# Patient Record
Sex: Female | Born: 2000 | Race: White | Hispanic: No | Marital: Single | Smoking: Current some day smoker
Health system: Southern US, Community
[De-identification: ages and names within clinical notes are randomized; demographics above are authoritative.]

---
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
## 2022-02-23 IMAGING — US US ABDOMEN LIMITED
1 series · 14 of 25 positions shown · non-contrast
Comparison: None.

CLINICAL DATA: Right upper quadrant abdominal pain.

EXAM:
ULTRASOUND ABDOMEN LIMITED RIGHT UPPER QUADRANT

[Series 1: us abdomen limited ruq (liver/gb) · 14 of 48 slices shown]
[im 1/48]
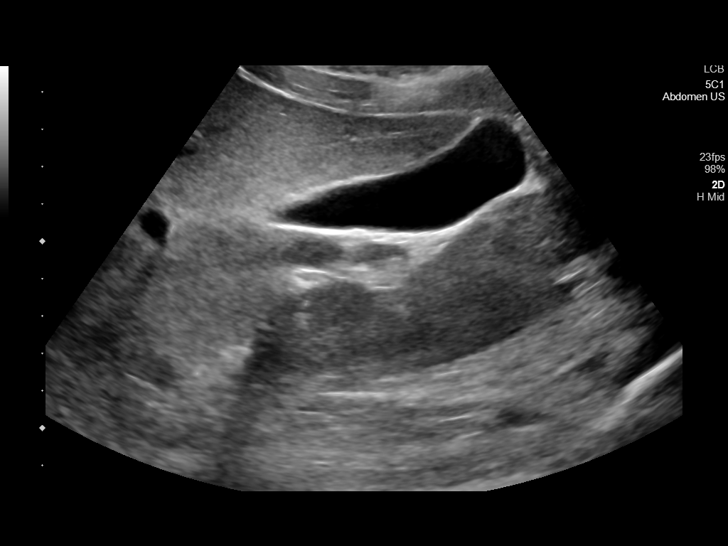
[im 4/48]
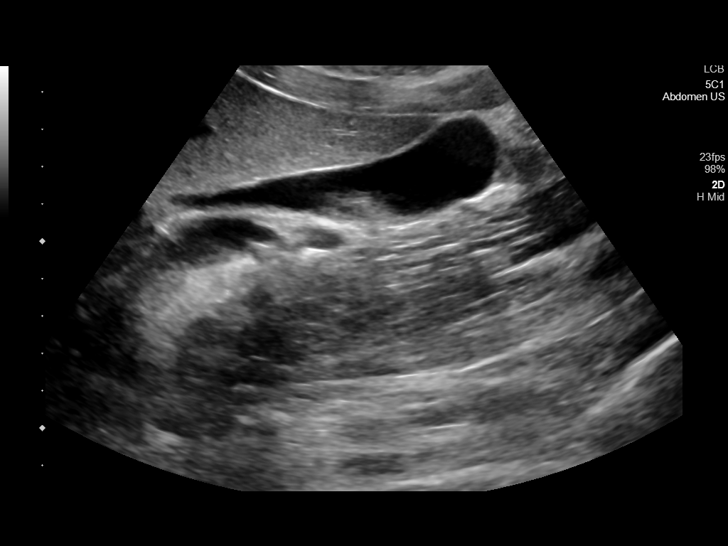
[im 8/48]
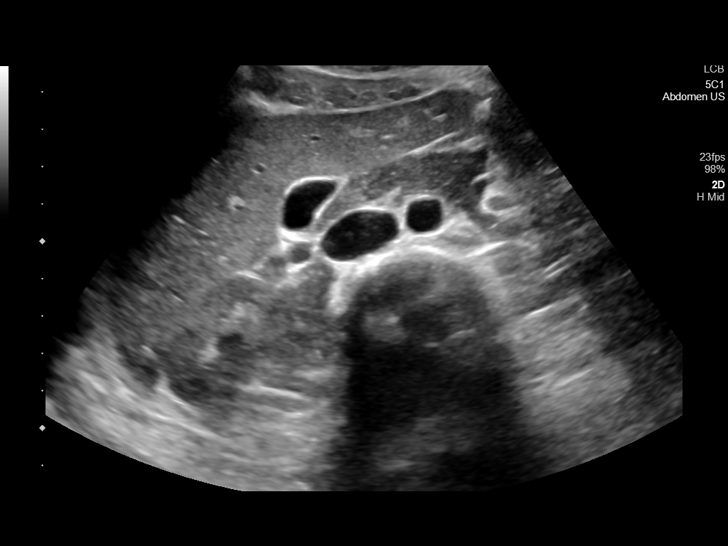
[im 12/48]
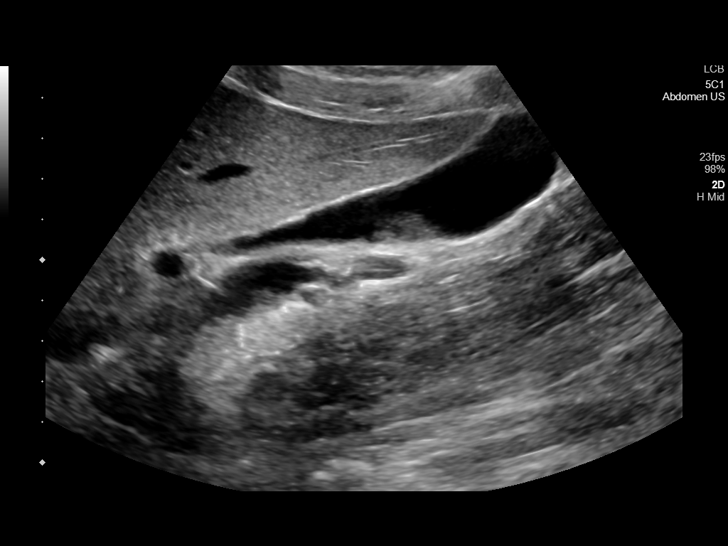
[im 16/48]
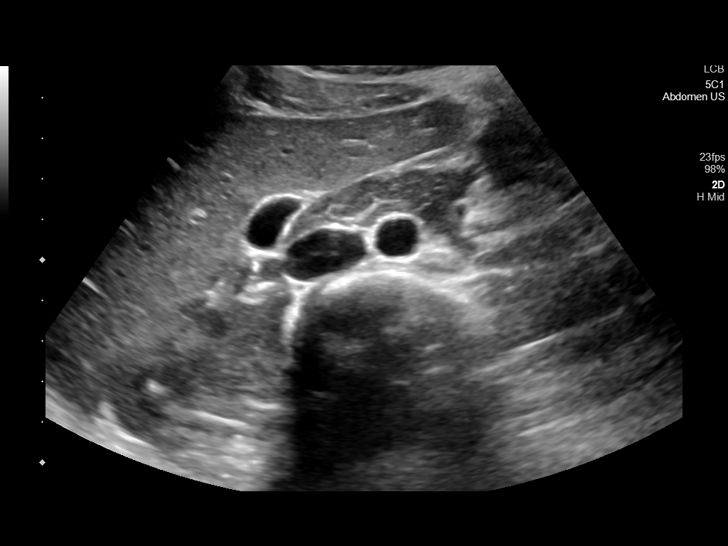
[im 18/48]
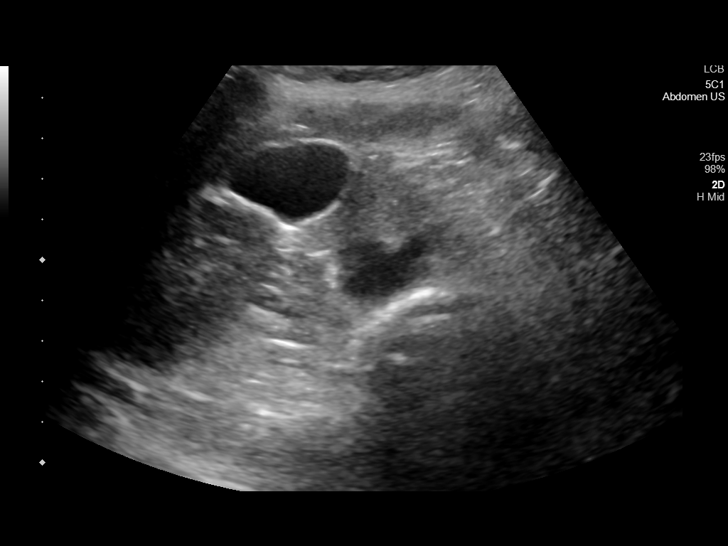
[im 22/48]
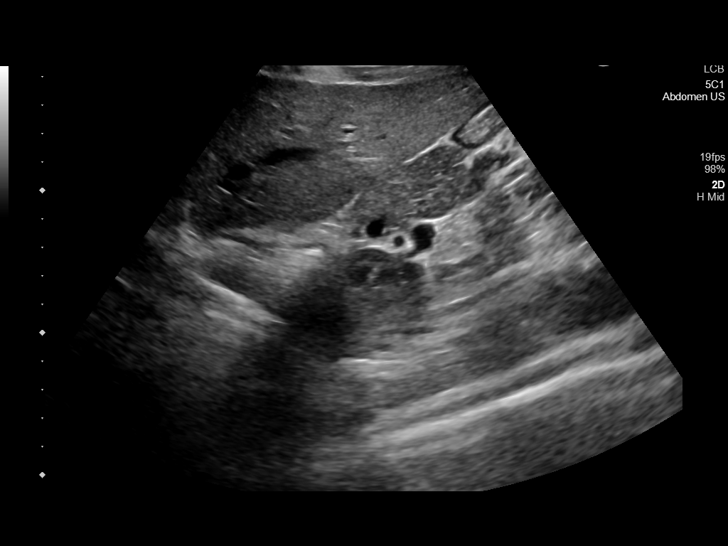
[im 26/48]
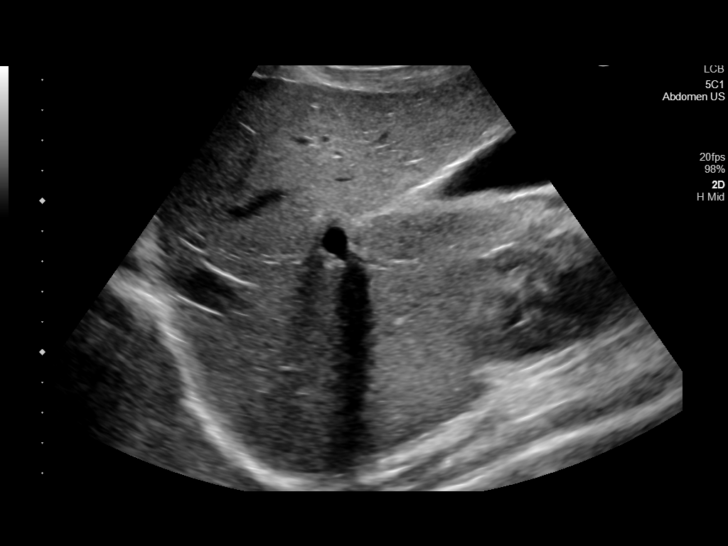
[im 30/48]
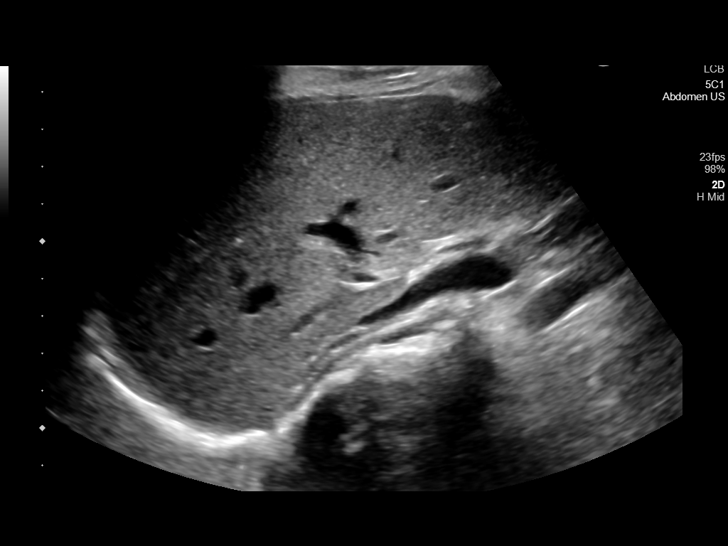
[im 32/48]
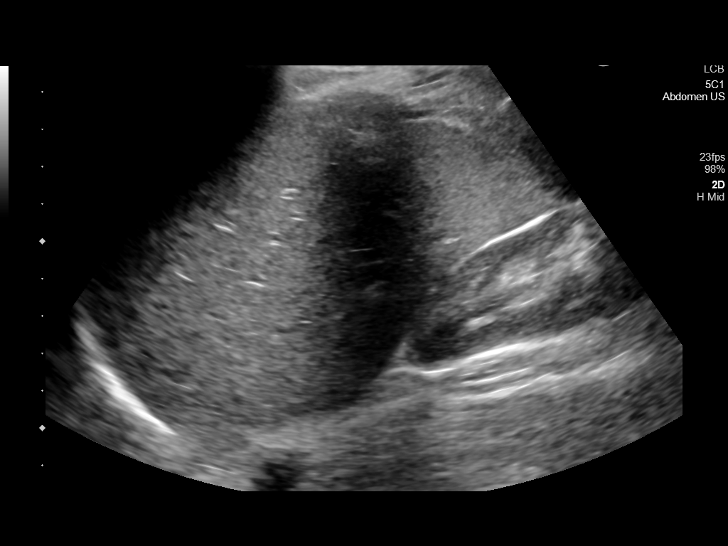
[im 36/48]
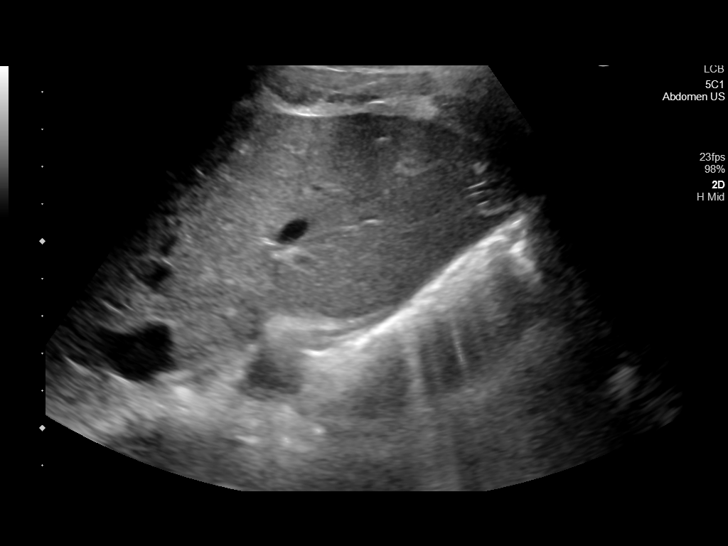
[im 40/48]
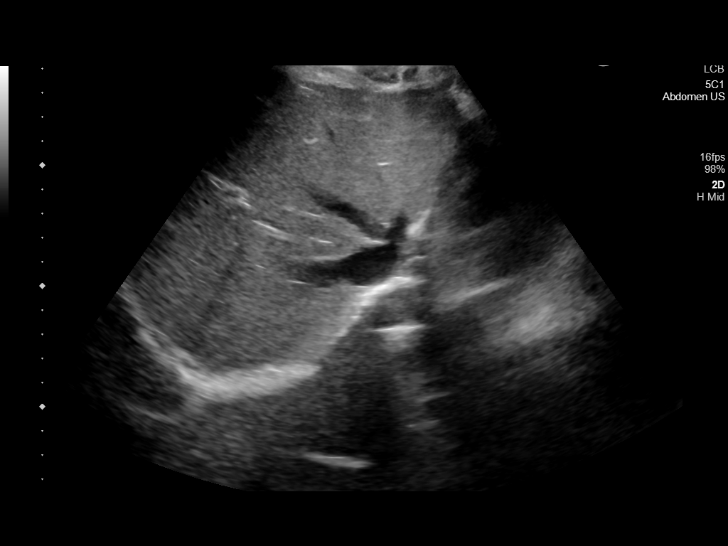
[im 44/48]
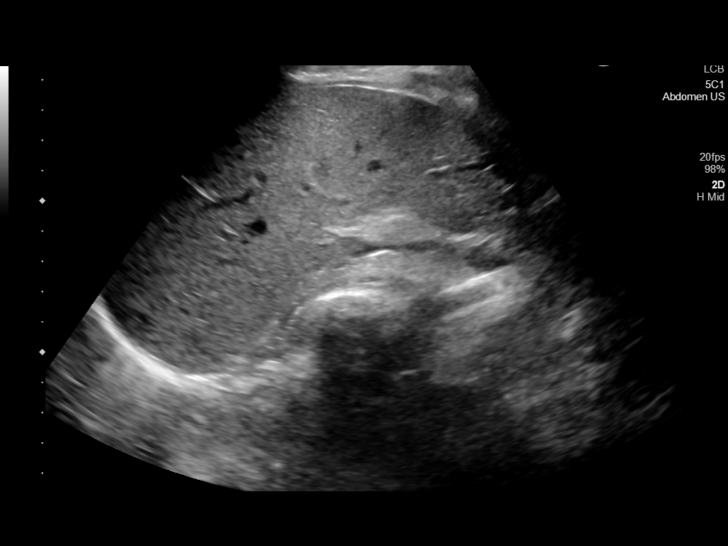
[im 48/48]
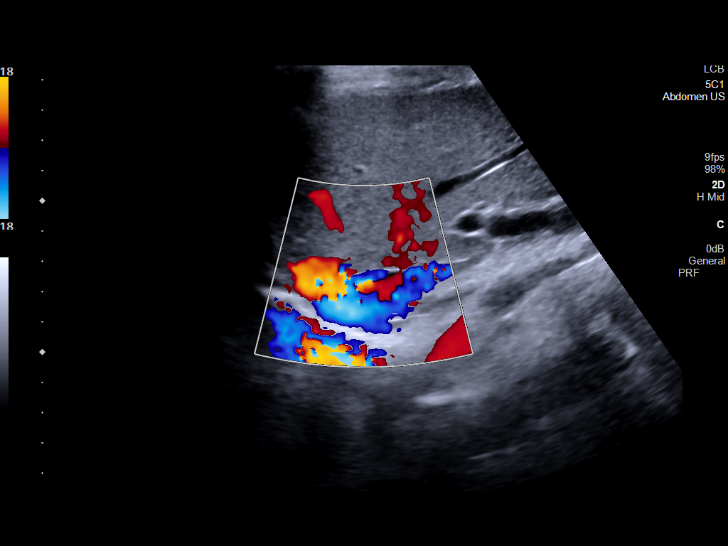

[14 of 25 positions shown; findings below may reference images not displayed]

FINDINGS: Gallbladder:

No gallstones or wall thickening visualized. No sonographic Murphy
sign noted by sonographer.

Common bile duct:

Diameter: 2 mm common normal.

Liver:

No focal lesion identified. Within normal limits in parenchymal
echogenicity. Portal vein is patent on color Doppler imaging with
normal direction of blood flow towards the liver.

Other: None.
IMPRESSION: Normal right upper quadrant ultrasound. No abnormality seen to
explain pain.
